# Patient Record
Sex: Female | Born: 1945 | Race: Black or African American | Hispanic: No | Marital: Single | Smoking: Never smoker
Health system: Southern US, Community
[De-identification: ages and names within clinical notes are randomized; demographics above are authoritative.]

## PROBLEM LIST (undated history)

## (undated) DIAGNOSIS — I10 Essential (primary) hypertension: Secondary | ICD-10-CM

---

## 2008-07-08 ENCOUNTER — Emergency Department (HOSPITAL_COMMUNITY): Admission: EM | Admit: 2008-07-08 | Discharge: 2008-07-08 | Payer: Self-pay | Admitting: Family Medicine

## 2013-09-18 ENCOUNTER — Encounter (HOSPITAL_COMMUNITY): Payer: Self-pay | Admitting: Emergency Medicine

## 2013-09-18 ENCOUNTER — Emergency Department (INDEPENDENT_AMBULATORY_CARE_PROVIDER_SITE_OTHER)
Admission: EM | Admit: 2013-09-18 | Discharge: 2013-09-18 | Disposition: A | Discharge status: Home / Self Care | Payer: Medicare Other | Source: Home / Self Care | Attending: Family Medicine | Admitting: Family Medicine

## 2013-09-18 DIAGNOSIS — J069 Acute upper respiratory infection, unspecified: Secondary | ICD-10-CM

## 2013-09-18 DIAGNOSIS — B9789 Other viral agents as the cause of diseases classified elsewhere: Principal | ICD-10-CM

## 2013-09-18 HISTORY — DX: Essential (primary) hypertension: I10

## 2013-09-18 MED ORDER — METHYLPREDNISOLONE 4 MG PO KIT: PACK | ORAL | Status: DC

## 2013-09-18 MED ORDER — GUAIFENESIN-CODEINE 100-10 MG/5ML PO SOLN: 10.0000 mL | Freq: Four times a day (QID) | ORAL | Status: AC | PRN

## 2013-09-18 NOTE — ED Provider Notes (Signed)
CSN: [id number]     Arrival date & time 09/18/13  1224 History   First MD Initiated Contact with Patient 09/18/13 1251     Chief Complaint  Patient presents with  . URI     (Consider location/radiation/quality/duration/timing/severity/associated sxs/prior Treatment) HPI Comments: Of cough and sore throat. For 4 days, she has had a bad cough she was having pain in her chest with coughing, but this is now resolved. She was having a bad sore throat, but this is better now too. All her symptoms are almost completely better now. She thinks she needs an antibiotic to make sure she stays better. denies pleuritic pain or shortness of breath  Patient is a 68 y.o. female presenting with URI.  URI Presenting symptoms: congestion and sore throat   Presenting symptoms: no cough and no fever   Associated symptoms: no arthralgias and no myalgias     Past Medical History  Diagnosis Date  . Hypertension    History reviewed. No pertinent past surgical history. History reviewed. No pertinent family history. History  Substance Use Topics  . Smoking status: Never Smoker   . Smokeless tobacco: Not on file  . Alcohol Use: No   OB History   Grav Para Term Preterm Abortions TAB SAB Ect Mult Living                 Review of Systems  Constitutional: Negative for fever and chills.  HENT: Positive for congestion and sore throat.   Eyes: Negative for visual disturbance.  Respiratory: Negative for cough and shortness of breath.   Cardiovascular: Negative for chest pain, palpitations and leg swelling.  Gastrointestinal: Negative for nausea, vomiting and abdominal pain.  Endocrine: Negative for polydipsia and polyuria.  Genitourinary: Negative for dysuria, urgency and frequency.  Musculoskeletal: Negative for arthralgias and myalgias.  Skin: Negative for rash.  Neurological: Negative for dizziness, weakness and light-headedness.      Allergies  Review of patient's allergies indicates no known  allergies.  Home Medications   Current Outpatient Rx  Name  Route  Sig  Dispense  Refill  . NIFEDIPINE PO   Oral   Take by mouth.         Marland Kitchen guaiFENesin-codeine 100-10 MG/5ML syrup   Oral   Take 10 mLs by mouth every 6 (six) hours as needed for cough.   120 mL   0   . methylPREDNISolone (MEDROL DOSEPAK) 4 MG tablet      follow package directions   21 tablet   0     Dispense as written.    BP 196/106  Pulse 90  Temp(Src) 98.4 F (36.9 C) (Oral)  Resp 20  SpO2 100% Physical Exam  Nursing note and vitals reviewed. Constitutional: She is oriented to person, place, and time. Vital signs are normal. She appears well-developed and well-nourished. No distress.  HENT:  Head: Normocephalic and atraumatic.  Right Ear: External ear normal.  Left Ear: External ear normal.  Nose: Nose normal.  Mouth/Throat: Oropharynx is clear and moist. No oropharyngeal exudate.  Eyes: Conjunctivae are normal. Right eye exhibits no discharge. Left eye exhibits no discharge.  Neck: Normal range of motion. Neck supple.  Cardiovascular: Normal rate, regular rhythm and normal heart sounds.  Exam reveals no gallop and no friction rub.   No murmur heard. Pulmonary/Chest: Effort normal. No respiratory distress. She has no wheezes. She has no rales.  Lymphadenopathy:    She has no cervical adenopathy.  Neurological: She is alert and oriented  to person, place, and time. She has normal strength. Coordination normal.  Skin: Skin is warm and dry. No rash noted. She is not diaphoretic.  Psychiatric: She has a normal mood and affect. Judgment normal.    ED Course  Procedures (including critical care time) Labs Review Labs Reviewed - No data to display Imaging Review No results found.    MDM   Final diagnoses:  Viral URI with cough    Physical exam is normal. Viral URI that is resolving. We'll treat symptomatically. Followup when necessary  Meds ordered this encounter  Medications  .  NIFEDIPINE PO    Sig: Take by mouth.  . methylPREDNISolone (MEDROL DOSEPAK) 4 MG tablet    Sig: follow package directions    Dispense:  21 tablet    Refill:  0    Order Specific Question:  Supervising Provider    Answer:  Clementeen GrahamOREY, EVAN, S [[id number]]  . guaiFENesin-codeine 100-10 MG/5ML syrup    Sig: Take 10 mLs by mouth every 6 (six) hours as needed for cough.    Dispense:  120 mL    Refill:  0    Order Specific Question:  Supervising Provider    Answer:  Clementeen GrahamOREY, EVAN, Kathie RhodesS [[id number]]      Graylon GoodZachary H Shaquia Berkley, PA-C 09/18/13 1304

## 2013-09-18 NOTE — ED Notes (Signed)
Pt  States  She  Has  Has  Symptoms  for  Several  Weeks    She  States  It  Started  Off  As  Sinus  Symptoms  But  Now  Has  Settled  In  Chest         She  Reports  Symptoms      Not  releived  By otc meds

## 2013-09-18 NOTE — Discharge Instructions (Signed)
Antibiotic Nonuse ° Your caregiver felt that the infection or problem was not one that would be helped with an antibiotic. °Infections may be caused by viruses or bacteria. Only a caregiver can tell which one of these is the likely cause of an illness. A cold is the most common cause of infection in both adults and children. A cold is a virus. Antibiotic treatment will have no effect on a viral infection. Viruses can lead to many lost days of work caring for sick children and many missed days of school. Children may catch as many as 10 "colds" or "flus" per year during which they can be tearful, cranky, and uncomfortable. The goal of treating a virus is aimed at keeping the ill person comfortable. °Antibiotics are medications used to help the body fight bacterial infections. There are relatively few types of bacteria that cause infections but there are hundreds of viruses. While both viruses and bacteria cause infection they are very different types of germs. A viral infection will typically go away by itself within 7 to 10 days. Bacterial infections may spread or get worse without antibiotic treatment. °Examples of bacterial infections are: °· Sore throats (like strep throat or tonsillitis). °· Infection in the lung (pneumonia). °· Ear and skin infections. °Examples of viral infections are: °· Colds or flus. °· Most coughs and bronchitis. °· Sore throats not caused by Strep. °· Runny noses. °It is often best not to take an antibiotic when a viral infection is the cause of the problem. Antibiotics can kill off the helpful bacteria that we have inside our body and allow harmful bacteria to start growing. Antibiotics can cause side effects such as allergies, nausea, and diarrhea without helping to improve the symptoms of the viral infection. Additionally, repeated uses of antibiotics can cause bacteria inside of our body to become resistant. That resistance can be passed onto harmful bacterial. The next time you have  an infection it may be harder to treat if antibiotics are used when they are not needed. Not treating with antibiotics allows our own immune system to develop and take care of infections more efficiently. Also, antibiotics will work better for us when they are prescribed for bacterial infections. °Treatments for a child that is ill may include: °· Give extra fluids throughout the day to stay hydrated. °· Get plenty of rest. °· Only give your child over-the-counter or prescription medicines for pain, discomfort, or fever as directed by your caregiver. °· The use of a cool mist humidifier may help stuffy noses. °· Cold medications if suggested by your caregiver. °Your caregiver may decide to start you on an antibiotic if: °· The problem you were seen for today continues for a longer length of time than expected. °· You develop a secondary bacterial infection. °SEEK MEDICAL CARE IF: °· Fever lasts longer than 5 days. °· Symptoms continue to get worse after 5 to 7 days or become severe. °· Difficulty in breathing develops. °· Signs of dehydration develop (poor drinking, rare urinating, dark colored urine). °· Changes in behavior or worsening tiredness (listlessness or lethargy). °Document Released: 09/20/2001 Document Revised: 10/04/2011 Document Reviewed: 03/19/2009 °ExitCare® Patient Information ©2014 [employer]. ° °Cough, Adult ° A cough is a reflex that helps clear your throat and airways. It can help heal the body or may be a reaction to an irritated airway. A cough may only last 2 or 3 weeks (acute) or may last more than 8 weeks (chronic).  °CAUSES °Acute cough: °· Viral or   bacterial infections. °Chronic cough: °· Infections. °· Allergies. °· Asthma. °· Post-nasal drip. °· Smoking. °· Heartburn or acid reflux. °· Some medicines. °· Chronic lung problems (COPD). °· Cancer. °SYMPTOMS  °· Cough. °· Fever. °· Chest pain. °· Increased breathing rate. °· High-pitched whistling sound when breathing  (wheezing). °· Colored mucus that you cough up (sputum). °TREATMENT  °· A bacterial cough may be treated with antibiotic medicine. °· A viral cough must run its course and will not respond to antibiotics. °· Your caregiver may recommend other treatments if you have a chronic cough. °HOME CARE INSTRUCTIONS  °· Only take over-the-counter or prescription medicines for pain, discomfort, or fever as directed by your caregiver. Use cough suppressants only as directed by your caregiver. °· Use a cold steam vaporizer or humidifier in your bedroom or home to help loosen secretions. °· Sleep in a semi-upright position if your cough is worse at night. °· Rest as needed. °· Stop smoking if you smoke. °SEEK IMMEDIATE MEDICAL CARE IF:  °· You have pus in your sputum. °· Your cough starts to worsen. °· You cannot control your cough with suppressants and are losing sleep. °· You begin coughing up blood. °· You have difficulty breathing. °· You develop pain which is getting worse or is uncontrolled with medicine. °· You have a fever. °MAKE SURE YOU:  °· Understand these instructions. °· Will watch your condition. °· Will get help right away if you are not doing well or get worse. °Document Released: 01/08/2011 Document Revised: 10/04/2011 Document Reviewed: 01/08/2011 °ExitCare® Patient Information ©2014 [employer]. ° °

## 2013-09-19 NOTE — ED Provider Notes (Signed)
Medical screening examination/treatment/procedure(s) were performed by a resident physician or non-physician practitioner and as the supervising physician I was immediately available for consultation/collaboration.  Najma Bozarth, MD    Eartha Vonbehren S Dorthea Maina, MD 09/19/13 0740 

## 2014-04-11 ENCOUNTER — Emergency Department (INDEPENDENT_AMBULATORY_CARE_PROVIDER_SITE_OTHER)
Admission: EM | Admit: 2014-04-11 | Discharge: 2014-04-11 | Disposition: A | Discharge status: Home / Self Care | Payer: Medicare Other | Source: Home / Self Care | Attending: Family Medicine | Admitting: Family Medicine

## 2014-04-11 ENCOUNTER — Encounter (HOSPITAL_COMMUNITY): Payer: Self-pay | Admitting: Emergency Medicine

## 2014-04-11 DIAGNOSIS — A088 Other specified intestinal infections: Secondary | ICD-10-CM

## 2014-04-11 DIAGNOSIS — A084 Viral intestinal infection, unspecified: Secondary | ICD-10-CM

## 2014-04-11 MED ORDER — ONDANSETRON HCL 4 MG PO TABS: 4.0000 mg | ORAL_TABLET | Freq: Three times a day (TID) | ORAL | Status: AC | PRN

## 2014-04-11 NOTE — Discharge Instructions (Signed)
Thank you for coming in today. Use over-the-counter Imodium as needed.  Use Zofran as needed for vomiting.   If your belly pain worsens, or you have high fever, bad vomiting, blood in your stool or black tarry stool go to the Emergency Room.   Viral Gastroenteritis Viral gastroenteritis is also known as stomach flu. This condition affects the stomach and intestinal tract. It can cause sudden diarrhea and vomiting. The illness typically lasts 3 to 8 days. Most people develop an immune response that eventually gets rid of the virus. While this natural response develops, the virus can make you quite ill. CAUSES  Many different viruses can cause gastroenteritis, such as rotavirus or noroviruses. You can catch one of these viruses by consuming contaminated food or water. You may also catch a virus by sharing utensils or other personal items with an infected person or by touching a contaminated surface. SYMPTOMS  The most common symptoms are diarrhea and vomiting. These problems can cause a severe loss of body fluids (dehydration) and a body salt (electrolyte) imbalance. Other symptoms may include:  Fever.  Headache.  Fatigue.  Abdominal pain. DIAGNOSIS  Your caregiver can usually diagnose viral gastroenteritis based on your symptoms and a physical exam. A stool sample may also be taken to test for the presence of viruses or other infections. TREATMENT  This illness typically goes away on its own. Treatments are aimed at rehydration. The most serious cases of viral gastroenteritis involve vomiting so severely that you are not able to keep fluids down. In these cases, fluids must be given through an intravenous line (IV). HOME CARE INSTRUCTIONS   Drink enough fluids to keep your urine clear or pale yellow. Drink small amounts of fluids frequently and increase the amounts as tolerated.  Ask your caregiver for specific rehydration instructions.  Avoid:  Foods high in  sugar.  Alcohol.  Carbonated drinks.  Tobacco.  Juice.  Caffeine drinks.  Extremely hot or cold fluids.  Fatty, greasy foods.  Too much intake of anything at one time.  Dairy products until 24 to 48 hours after diarrhea stops.  You may consume probiotics. Probiotics are active cultures of beneficial bacteria. They may lessen the amount and number of diarrheal stools in adults. Probiotics can be found in yogurt with active cultures and in supplements.  Wash your hands well to avoid spreading the virus.  Only take over-the-counter or prescription medicines for pain, discomfort, or fever as directed by your caregiver. Do not give aspirin to children. Antidiarrheal medicines are not recommended.  Ask your caregiver if you should continue to take your regular prescribed and over-the-counter medicines.  Keep all follow-up appointments as directed by your caregiver. SEEK IMMEDIATE MEDICAL CARE IF:   You are unable to keep fluids down.  You do not urinate at least once every 6 to 8 hours.  You develop shortness of breath.  You notice blood in your stool or vomit. This may look like coffee grounds.  You have abdominal pain that increases or is concentrated in one small area (localized).  You have persistent vomiting or diarrhea.  You have a fever.  The patient is a child younger than 3 months, and he or she has a fever.  The patient is a child older than 3 months, and he or she has a fever and persistent symptoms.  The patient is a child older than 3 months, and he or she has a fever and symptoms suddenly get worse.  The patient is a  baby, and he or she has no tears when crying. MAKE SURE YOU:   Understand these instructions.  Will watch your condition.  Will get help right away if you are not doing well or get worse. Document Released: 07/12/2005 Document Revised: 10/04/2011 Document Reviewed: 04/28/2011 [facility] Patient Information 2015 [place], [place]. This  information is not intended to replace advice given to you by your health care provider. Make sure you discuss any questions you have with your health care provider.

## 2014-04-11 NOTE — ED Provider Notes (Signed)
Jun Acocella is a 68 y.o. female who presents to Urgent Care today for diarrhea. Patient has a two-day history of diarrhea associated with nausea. No fevers or chills. No abdominal pain. No blood in the stool. No medications tried yet. Patient feels well otherwise.   Past Medical History  Diagnosis Date  . Hypertension    History  Substance Use Topics  . Smoking status: Never Smoker   . Smokeless tobacco: Not on file  . Alcohol Use: No   ROS as above Medications: No current facility-administered medications for this encounter.   Current Outpatient Prescriptions  Medication Sig Dispense Refill  . LISINOPRIL PO Take by mouth.      Marland Kitchen guaiFENesin-codeine 100-10 MG/5ML syrup Take 10 mLs by mouth every 6 (six) hours as needed for cough.  120 mL  0  . methylPREDNISolone (MEDROL DOSEPAK) 4 MG tablet follow package directions  21 tablet  0  . NIFEDIPINE PO Take by mouth.      . ondansetron (ZOFRAN) 4 MG tablet Take 1 tablet (4 mg total) by mouth every 8 (eight) hours as needed for nausea or vomiting.  20 tablet  0    Exam:  BP 134/70  Pulse 83  Temp(Src) 98.3 F (36.8 C) (Oral)  Resp 16  SpO2 100% Gen: Well NAD nontoxic appearing HEENT: EOMI,  MMM Lungs: Normal work of breathing. CTABL Heart: RRR no MRG Abd: NABS, Soft. Nondistended, Nontender Exts: Brisk capillary refill, warm and well perfused.   No results found for this or any previous visit (from the past 24 hour(s)). No results found.  Assessment and Plan: 68 y.o. female with viral gastroenteritis. Plan to treat with Imodium and Zofran. Watchful waiting. Followup as needed.  Discussed warning signs or symptoms. Please see discharge instructions. Patient expresses understanding.     Rodolph Bong, MD 04/11/14 1224

## 2014-04-11 NOTE — ED Notes (Signed)
Pt triaged and assessed by provider.   Provider in room before nurse.  

## 2014-07-03 ENCOUNTER — Encounter (HOSPITAL_COMMUNITY): Payer: Self-pay | Admitting: Emergency Medicine

## 2014-07-03 ENCOUNTER — Emergency Department (INDEPENDENT_AMBULATORY_CARE_PROVIDER_SITE_OTHER)
Admission: EM | Admit: 2014-07-03 | Discharge: 2014-07-03 | Disposition: A | Discharge status: Home / Self Care | Payer: Medicare Other | Source: Home / Self Care | Attending: Emergency Medicine | Admitting: Emergency Medicine

## 2014-07-03 DIAGNOSIS — J9801 Acute bronchospasm: Secondary | ICD-10-CM

## 2014-07-03 DIAGNOSIS — J189 Pneumonia, unspecified organism: Secondary | ICD-10-CM

## 2014-07-03 MED ORDER — BENZONATATE 100 MG PO CAPS: 100.0000 mg | ORAL_CAPSULE | Freq: Three times a day (TID) | ORAL | Status: AC | PRN

## 2014-07-03 MED ORDER — ALBUTEROL SULFATE HFA 108 (90 BASE) MCG/ACT IN AERS: 1.0000 | INHALATION_SPRAY | Freq: Four times a day (QID) | RESPIRATORY_TRACT | Status: AC | PRN

## 2014-07-03 MED ORDER — AZITHROMYCIN 250 MG PO TABS: 250.0000 mg | ORAL_TABLET | Freq: Every day | ORAL | Status: DC

## 2014-07-03 NOTE — ED Notes (Signed)
68 year old female here today with complaints of what she states as "flu like symptoms for the past 2 weeks.  States it started off with a cough. Her cough has deepened  Chest is now hurting  Nausea and vomiting last evening. Patients O2 Sat on room air is 95 %   Right lung has rales in middle lobe.  Lung sounds decreased in right lower lobe.

## 2014-07-03 NOTE — ED Provider Notes (Signed)
CSN: [id number]     Arrival date & time 07/03/14  1230 History   First MD Initiated Contact with Patient 07/03/14 1240     Chief Complaint  Patient presents with  . Cough   (Consider location/radiation/quality/duration/timing/severity/associated sxs/prior Treatment) HPI Comments: Reports development of wheezing and chills over last few days. Denies dyspnea, chest pain, fever, hemoptysis, night sweats, weight loss. Is a non-smoker. PCP: Dr. Michelle NasutiStephanie Powell Works in Set designermanufacturing at [employer]  Patient is a 68 y.o. female presenting with URI. The history is provided by the patient.  URI Presenting symptoms: congestion and cough   Presenting symptoms: no fever   Presenting symptoms comment:  +chills Severity:  Moderate Onset quality:  Gradual Duration:  2 weeks Timing:  Constant Progression:  Worsening Chronicity:  New Associated symptoms: wheezing   Associated symptoms: no headaches   Risk factors: no immunosuppression and no sick contacts     Past Medical History  Diagnosis Date  . Hypertension    History reviewed. No pertinent past surgical history. No family history on file. History  Substance Use Topics  . Smoking status: Never Smoker   . Smokeless tobacco: Not on file  . Alcohol Use: No   OB History    No data available     Review of Systems  Constitutional: Positive for chills. Negative for fever.  HENT: Positive for congestion.   Respiratory: Positive for cough and wheezing.   Cardiovascular: Negative.   Gastrointestinal: Negative.   Musculoskeletal: Negative for back pain.  Skin: Negative.   Neurological: Negative for dizziness, weakness, light-headedness and headaches.    Allergies  Review of patient's allergies indicates no known allergies.  Home Medications   Prior to Admission medications   Medication Sig Start Date End Date Taking? Authorizing Provider  amLODipine (NORVASC) 10 MG tablet Take 10 mg by mouth daily.   Yes Historical Provider, MD    albuterol (PROVENTIL HFA;VENTOLIN HFA) 108 (90 BASE) MCG/ACT inhaler Inhale 1-2 puffs into the lungs every 6 (six) hours as needed for wheezing or shortness of breath (or persistent coughing). 07/03/14   Mathis FareJennifer Lee H Shanyia Stines, PA  azithromycin (ZITHROMAX) 250 MG tablet Take 1 tablet (250 mg total) by mouth daily. Take first 2 tablets together, then 1 every day until finished. 07/03/14   Mathis FareJennifer Lee H Suha Schoenbeck, PA  benzonatate (TESSALON) 100 MG capsule Take 1 capsule (100 mg total) by mouth 3 (three) times daily as needed for cough. 07/03/14   Mathis FareJennifer Lee H Declan Adamson, PA  guaiFENesin-codeine 100-10 MG/5ML syrup Take 10 mLs by mouth every 6 (six) hours as needed for cough. 09/18/13   Graylon GoodZachary H Baker, PA-C  LISINOPRIL PO Take by mouth.    Historical Provider, MD  methylPREDNISolone (MEDROL DOSEPAK) 4 MG tablet follow package directions 09/18/13   Graylon GoodZachary H Baker, PA-C  NIFEDIPINE PO Take by mouth.    Historical Provider, MD  ondansetron (ZOFRAN) 4 MG tablet Take 1 tablet (4 mg total) by mouth every 8 (eight) hours as needed for nausea or vomiting. 04/11/14   Rodolph BongEvan S Corey, MD   BP 125/72 mmHg  Pulse 92  Temp(Src) 99.9 F (37.7 C) (Oral)  Resp 17  SpO2 95% Physical Exam  Constitutional: She is oriented to person, place, and time. She appears well-developed and well-nourished. No distress.  HENT:  Head: Normocephalic and atraumatic.  Right Ear: Hearing, tympanic membrane, external ear and ear canal normal.  Left Ear: Hearing, tympanic membrane, external ear and ear canal normal.  Nose: Nose normal.  Mouth/Throat: Uvula is midline, oropharynx is clear and moist and mucous membranes are normal.  Eyes: Conjunctivae are normal. No scleral icterus.  Neck: Normal range of motion. Neck supple.  Cardiovascular: Normal rate, regular rhythm and normal heart sounds.   Pulmonary/Chest: Effort normal. No accessory muscle usage. No respiratory distress. She has wheezes. She has rhonchi in the right lower field.  She has no rales.    Outlined area is with coarse persistent rhonchi Diffuse mild end expiratory wheezing  Lymphadenopathy:    She has no cervical adenopathy.  Neurological: She is alert and oriented to person, place, and time.  Skin: Skin is warm and dry.  Psychiatric: She has a normal mood and affect. Her behavior is normal.  Nursing note and vitals reviewed.   ED Course  Procedures (including critical care time) Labs Review Labs Reviewed - No data to display  Imaging Review No results found.   MDM   1. CAP (community acquired pneumonia)   2. Bronchospasm     Exam suggests right lower lung field consolidation with associated mild bronchospasm without hypoxia or distress. Hemodynamically stable and without indications of sepsis. Will treat for CAP with 5 day course of azithromycin along with tessalon for cough and albuterol MDI for bronchospasm. Advise close follow up with either UCC or PCP.    Ria ClockJennifer Lee H Ozetta Flatley, [place]PA 07/03/14 1329

## 2014-07-03 NOTE — Discharge Instructions (Signed)
Bronchospasm  A bronchospasm is a spasm or tightening of the airways going into the lungs. During a bronchospasm breathing becomes more difficult because the airways get smaller. When this happens there can be coughing, a whistling sound when breathing (wheezing), and difficulty breathing. Bronchospasm is often associated with asthma, but not all patients who experience a bronchospasm have asthma.  CAUSES   A bronchospasm is caused by inflammation or irritation of the airways. The inflammation or irritation may be triggered by:    Allergies (such as to animals, pollen, food, or mold). Allergens that cause bronchospasm may cause wheezing immediately after exposure or many hours later.    Infection. Viral infections are believed to be the most common cause of bronchospasm.    Exercise.    Irritants (such as pollution, cigarette smoke, strong odors, aerosol sprays, and paint fumes).    Weather changes. Winds increase molds and pollens in the air. Rain refreshes the air by washing irritants out. Cold air may cause inflammation.    Stress and emotional upset.   SIGNS AND SYMPTOMS    Wheezing.    Excessive nighttime coughing.    Frequent or severe coughing with a simple cold.    Chest tightness.    Shortness of breath.   DIAGNOSIS   Bronchospasm is usually diagnosed through a history and physical exam. Tests, such as chest X-rays, are sometimes done to look for other conditions.  TREATMENT    Inhaled medicines can be given to open up your airways and help you breathe. The medicines can be given using either an inhaler or a nebulizer machine.   Corticosteroid medicines may be given for severe bronchospasm, usually when it is associated with asthma.  HOME CARE INSTRUCTIONS    Always have a plan prepared for seeking medical care. Know when to call your health care provider and local emergency services (911 in the [place] Know where you can access local emergency care.   Only take medicines as  directed by your health care provider.   If you were prescribed an inhaler or nebulizer machine, ask your health care provider to explain how to use it correctly. Always use a spacer with your inhaler if you were given one.   It is necessary to remain calm during an attack. Try to relax and breathe more slowly.   Control your home environment in the following ways:    Change your heating and air conditioning filter at least once a month.    Limit your use of fireplaces and wood stoves.   Do not smoke and do not allow smoking in your home.    Avoid exposure to perfumes and fragrances.    Get rid of pests (such as roaches and mice) and their droppings.    Throw away plants if you see mold on them.    Keep your house clean and dust free.    Replace carpet with wood, tile, or vinyl flooring. Carpet can trap dander and dust.    Use allergy-proof pillows, mattress covers, and box spring covers.    Wash bed sheets and blankets every week in hot water and dry them in a dryer.    Use blankets that are made of polyester or cotton.    Wash hands frequently.  SEEK MEDICAL CARE IF:    You have muscle aches.    You have chest pain.    The sputum changes from clear or white to yellow, green, gray, or bloody.    The sputum you   cough up gets thicker.    There are problems that may be related to the medicine you are given, such as a rash, itching, swelling, or trouble breathing.   SEEK IMMEDIATE MEDICAL CARE IF:    You have worsening wheezing and coughing even after taking your prescribed medicines.    You have increased difficulty breathing.    You develop severe chest pain.  MAKE SURE YOU:    Understand these instructions.   Will watch your condition.   Will get help right away if you are not doing well or get worse.  Document Released: 07/15/2003 Document Revised: 07/17/2013 Document Reviewed: 01/01/2013  Exit[employer] Patient Information 2015 [employer]. This information is not  intended to replace advice given to you by your health care provider. Make sure you discuss any questions you have with your health care provider.  Pneumonia  Pneumonia is an infection of the lungs.   CAUSES  Pneumonia may be caused by bacteria or a virus. Usually, these infections are caused by breathing infectious particles into the lungs (respiratory tract).  SIGNS AND SYMPTOMS    Cough.   Fever.   Chest pain.   Increased rate of breathing.   Wheezing.   Mucus production.  DIAGNOSIS   If you have the common symptoms of pneumonia, your health care provider will typically confirm the diagnosis with a chest X-ray. The X-ray will show an abnormality in the lung (pulmonary infiltrate) if you have pneumonia. Other tests of your blood, urine, or sputum may be done to find the specific cause of your pneumonia. Your health care provider may also do tests (blood gases or pulse oximetry) to see how well your lungs are working.  TREATMENT   Some forms of pneumonia may be spread to other people when you cough or sneeze. You may be asked to wear a mask before and during your exam. Pneumonia that is caused by bacteria is treated with antibiotic medicine. Pneumonia that is caused by the influenza virus may be treated with an antiviral medicine. Most other viral infections must run their course. These infections will not respond to antibiotics.   HOME CARE INSTRUCTIONS    Cough suppressants may be used if you are losing too much rest. However, coughing protects you by clearing your lungs. You should avoid using cough suppressants if you can.   Your health care provider may have prescribed medicine if he or she thinks your pneumonia is caused by bacteria or influenza. Finish your medicine even if you start to feel better.   Your health care provider may also prescribe an expectorant. This loosens the mucus to be coughed up.   Take medicines only as directed by your health care provider.   Do not smoke. Smoking is a  common cause of bronchitis and can contribute to pneumonia. If you are a smoker and continue to smoke, your cough may last several weeks after your pneumonia has cleared.   A cold steam vaporizer or humidifier in your room or home may help loosen mucus.   Coughing is often worse at night. Sleeping in a semi-upright position in a recliner or using a couple pillows under your head will help with this.   Get rest as you feel it is needed. Your body will usually let you know when you need to rest.  PREVENTION  A pneumococcal shot (vaccine) is available to prevent a common bacterial cause of pneumonia. This is usually suggested for:   People over 65 years old.     Patients on chemotherapy.   People with chronic lung problems, such as bronchitis or emphysema.   People with immune system problems.  If you are over 65 or have a high risk condition, you may receive the pneumococcal vaccine if you have not received it before. In some countries, a routine influenza vaccine is also recommended. This vaccine can help prevent some cases of pneumonia.You may be offered the influenza vaccine as part of your care.  If you smoke, it is time to quit. You may receive instructions on how to stop smoking. Your health care provider can provide medicines and counseling to help you quit.  SEEK MEDICAL CARE IF:  You have a fever.  SEEK IMMEDIATE MEDICAL CARE IF:    Your illness becomes worse. This is especially true if you are elderly or weakened from any other disease.   You cannot control your cough with suppressants and are losing sleep.   You begin coughing up blood.   You develop pain which is getting worse or is uncontrolled with medicines.   Any of the symptoms which initially brought you in for treatment are getting worse rather than better.   You develop shortness of breath or chest pain.  MAKE SURE YOU:    Understand these instructions.   Will watch your condition.   Will get help right away if you are not doing well  or get worse.  Document Released: 07/12/2005 Document Revised: 11/26/2013 Document Reviewed: 10/01/2010  Exit[employer] Patient Information 2015 [employer]. This information is not intended to replace advice given to you by your health care provider. Make sure you discuss any questions you have with your health care provider.

## 2018-05-15 ENCOUNTER — Encounter (HOSPITAL_COMMUNITY): Payer: Self-pay | Admitting: Emergency Medicine

## 2018-05-15 ENCOUNTER — Emergency Department (HOSPITAL_COMMUNITY): Payer: Medicare Other

## 2018-05-15 ENCOUNTER — Emergency Department (HOSPITAL_COMMUNITY)
Admission: EM | Admit: 2018-05-15 | Discharge: 2018-05-15 | Discharge status: Against Medical Advice | Payer: Medicare Other | Attending: Emergency Medicine | Admitting: Emergency Medicine

## 2018-05-15 DIAGNOSIS — Z5320 Procedure and treatment not carried out because of patient's decision for unspecified reasons: Secondary | ICD-10-CM | POA: Insufficient documentation

## 2018-05-15 DIAGNOSIS — M25571 Pain in right ankle and joints of right foot: Secondary | ICD-10-CM | POA: Insufficient documentation

## 2018-05-15 DIAGNOSIS — M25562 Pain in left knee: Secondary | ICD-10-CM | POA: Diagnosis not present

## 2018-05-15 DIAGNOSIS — M542 Cervicalgia: Secondary | ICD-10-CM | POA: Diagnosis present

## 2018-05-15 DIAGNOSIS — Z5329 Procedure and treatment not carried out because of patient's decision for other reasons: Secondary | ICD-10-CM

## 2018-05-15 DIAGNOSIS — I10 Essential (primary) hypertension: Secondary | ICD-10-CM | POA: Diagnosis not present
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
## 2018-05-15 NOTE — Discharge Instructions (Addendum)
Today you refused a CT scan of your head, the medical advice is for you to obtain the CT scan of your head.  You are at an increased risk of having a head injury that over time can evolve into serious bleeding which could lead to stroke, disability which may be severe, and death.  As you refused this scan you are leaving the hospital AGAINST MEDICAL ADVICE.  If you develop any concerning symptoms, or wish for the scan to be performed please return to the emergency room.  Please take Tylenol (acetaminophen) to relieve your pain.  You may take tylenol, up to 1,000 mg (two extra strength pills).  Do not take more than 3,000 mg tylenol in a 24 hour period.  Please check all medication labels as many medications such as pain and cold medications may contain tylenol. Please do not drink alcohol while taking this medication.

## 2018-05-15 NOTE — ED Notes (Signed)
ED Provider at bedside. 
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
## 2018-05-15 NOTE — ED Notes (Signed)
Pt states she wants to leave. States she has notified the PA. This RN advised against pt leaving prior to being discharged. Pt states she still is going to leave.

## 2018-06-24 ENCOUNTER — Emergency Department (HOSPITAL_COMMUNITY)
Admission: EM | Admit: 2018-06-24 | Discharge: 2018-06-24 | Disposition: A | Discharge status: Home / Self Care | Payer: Medicare Other | Attending: Emergency Medicine | Admitting: Emergency Medicine

## 2018-06-24 ENCOUNTER — Emergency Department (HOSPITAL_COMMUNITY): Payer: Medicare Other

## 2018-06-24 ENCOUNTER — Other Ambulatory Visit: Payer: Self-pay

## 2018-06-24 DIAGNOSIS — R05 Cough: Secondary | ICD-10-CM | POA: Diagnosis present

## 2018-06-24 DIAGNOSIS — Z7982 Long term (current) use of aspirin: Secondary | ICD-10-CM | POA: Diagnosis not present

## 2018-06-24 DIAGNOSIS — Z79899 Other long term (current) drug therapy: Secondary | ICD-10-CM | POA: Diagnosis not present

## 2018-06-24 DIAGNOSIS — J209 Acute bronchitis, unspecified: Secondary | ICD-10-CM | POA: Diagnosis not present

## 2018-06-24 DIAGNOSIS — I10 Essential (primary) hypertension: Secondary | ICD-10-CM | POA: Diagnosis not present

## 2018-06-24 LAB — CBC WITH DIFFERENTIAL/PLATELET
ABS IMMATURE GRANULOCYTES: 0.03 10*3/uL (ref 0.00–0.07)
BASOS ABS: 0.1 10*3/uL (ref 0.0–0.1)
Basophils Relative: 1 %
Eosinophils Absolute: 0.5 10*3/uL (ref 0.0–0.5)
Eosinophils Relative: 9 %
HCT: 38.3 % (ref 36.0–46.0)
HEMOGLOBIN: 11.7 g/dL — AB (ref 12.0–15.0)
Immature Granulocytes: 1 %
Lymphocytes Relative: 35 %
Lymphs Abs: 2.1 10*3/uL (ref 0.7–4.0)
MCH: 27.9 pg (ref 26.0–34.0)
MCHC: 30.5 g/dL (ref 30.0–36.0)
MCV: 91.4 fL (ref 80.0–100.0)
Monocytes Absolute: 0.4 10*3/uL (ref 0.1–1.0)
Monocytes Relative: 7 %
Neutro Abs: 2.8 10*3/uL (ref 1.7–7.7)
Neutrophils Relative %: 47 %
Platelets: 390 10*3/uL (ref 150–400)
RBC: 4.19 MIL/uL (ref 3.87–5.11)
RDW: 13.4 % (ref 11.5–15.5)
WBC: 5.9 10*3/uL (ref 4.0–10.5)
nRBC: 0 % (ref 0.0–0.2)

## 2018-06-24 LAB — BASIC METABOLIC PANEL
Anion gap: 12 (ref 5–15)
BUN: 17 mg/dL (ref 8–23)
CALCIUM: 9.4 mg/dL (ref 8.9–10.3)
CO2: 24 mmol/L (ref 22–32)
CREATININE: 1.15 mg/dL — AB (ref 0.44–1.00)
Chloride: 102 mmol/L (ref 98–111)
GFR calc Af Amer: 56 mL/min — ABNORMAL LOW (ref >60)
GFR calc non Af Amer: 48 mL/min — ABNORMAL LOW (ref >60)
Glucose, Bld: 91 mg/dL (ref 70–99)
Potassium: 3.5 mmol/L (ref 3.5–5.1)
Sodium: 138 mmol/L (ref 135–145)

## 2018-06-24 MED ORDER — DOXYCYCLINE HYCLATE 100 MG PO CAPS: 100.0000 mg | ORAL_CAPSULE | Freq: Two times a day (BID) | ORAL | 0 refills | Status: AC

## 2018-06-24 NOTE — Discharge Instructions (Addendum)
Drinking plenty of fluids and getting plenty of rest.  You can use Robitussin-DM as needed for cough.  Use Tylenol for pain or fever.

## 2018-06-24 NOTE — ED Triage Notes (Signed)
Pt states she has had cold symptoms for three weeks now. Pt states she is coughing up some white mucus with her cough. Pt complains of some chest pain only when coughing.

## 2018-06-24 NOTE — ED Provider Notes (Signed)
[facility] EMERGENCY DEPARTMENT Provider Note   CSN: [id number] Arrival date & time: 06/24/18  0719     History   Chief Complaint Chief Complaint  Patient presents with  . Cough    HPI Wanda Noble is a 72 y.o. female.  HPI   She presents for evaluation of cough, for 3 weeks, productive of white sputum without blood in it.  No history of asthma, bronchitis or emphysema.  She denies fever, chills, nausea, vomiting, weakness or dizziness.  There are no other known modifying factors.  Past Medical History:  Diagnosis Date  . Hypertension     Patient Active Problem List   Diagnosis Date Noted  . Acute pain of left knee 05/15/2018    No past surgical history on file.   OB History   None      Home Medications    Prior to Admission medications   Medication Sig Start Date End Date Taking? Authorizing Provider  acetaminophen (TYLENOL) 500 MG tablet Take 500 mg by mouth every 6 (six) hours as needed for mild pain or fever.   Yes [provider]  amLODipine (NORVASC) 10 MG tablet Take 10 mg by mouth daily.   Yes [provider]  aspirin 81 MG chewable tablet Chew 81 mg by mouth daily.   Yes [provider]  atorvastatin (LIPITOR) 10 MG tablet Take 10 mg by mouth. 02/21/18  Yes [provider]  benzonatate (TESSALON) 100 MG capsule Take 1 capsule (100 mg total) by mouth 3 (three) times daily as needed for cough. 07/03/14  Yes Presson, Jess Barters H, PA  Calcium-Magnesium-Vitamin D (CALCIUM 1200+D3 PO) Take 1 tablet by mouth daily.    Yes [provider]  hydrochlorothiazide (HYDRODIURIL) 25 MG tablet Take 25 mg by mouth daily.   Yes [provider]  albuterol (PROVENTIL HFA;VENTOLIN HFA) 108 (90 BASE) MCG/ACT inhaler Inhale 1-2 puffs into the lungs every 6 (six) hours as needed for wheezing or shortness of breath (or persistent coughing). Patient not taking: Reported on 06/24/2018 07/03/14   Ria Clock, PA  doxycycline (VIBRAMYCIN) 100 MG capsule Take 1 capsule (100 mg total) by mouth 2 (two) times daily. One po bid x 7 days 06/24/18   Mancel Bale, MD  guaiFENesin-codeine 100-10 MG/5ML syrup Take 10 mLs by mouth every 6 (six) hours as needed for cough. Patient not taking: Reported on 06/24/2018 09/18/13   Autumn Messing H, PA-C  ondansetron (ZOFRAN) 4 MG tablet Take 1 tablet (4 mg total) by mouth every 8 (eight) hours as needed for nausea or vomiting. Patient not taking: Reported on 06/24/2018 04/11/14   Rodolph Bong, MD    Family History No family history on file.  Social History Social History   Tobacco Use  . Smoking status: Never Smoker  Substance Use Topics  . Alcohol use: No  . Drug use: No     Allergies   Patient has no known allergies.   Review of Systems Review of Systems  All other systems reviewed and are negative.    Physical Exam Updated Vital Signs BP (!) 170/67 (BP Location: Right Arm)   Pulse 72   Temp 98.5 F (36.9 C) (Oral)   Resp 16   Ht 5' 4.5" (1.638 m)   Wt 85.7 kg   SpO2 97%   BMI 31.94 kg/m   Physical Exam  Constitutional: She is oriented to person, place, and time. She appears well-developed. No distress.  Elderly, frail  HENT:  Head: Normocephalic and atraumatic.  Eyes: Pupils are equal, round, and reactive to light. Conjunctivae and EOM are normal.  Neck: Normal range of motion and phonation normal. Neck supple.  Cardiovascular: Normal rate and regular rhythm.  Pulmonary/Chest: Effort normal. She exhibits no tenderness.  Rhonchi left base only.  No rales or wheezes.  There is no increased work of breathing.  Musculoskeletal: Normal range of motion. She exhibits no edema, tenderness or deformity.  Neurological: She is alert and oriented to person, place, and time. She exhibits normal muscle tone.  Skin: Skin is warm and dry.  Psychiatric: She has a normal mood and affect. Her behavior is normal. Judgment and  thought content normal.  Nursing note and vitals reviewed.    ED Treatments / Results  Labs (all labs ordered are listed, but only abnormal results are displayed) Labs Reviewed  BASIC METABOLIC PANEL - Abnormal; Notable for the following components:      Result Value   Creatinine, Ser 1.15 (*)    GFR calc non Af Amer 48 (*)    GFR calc Af Amer 56 (*)    All other components within normal limits  CBC WITH DIFFERENTIAL/PLATELET - Abnormal; Notable for the following components:   Hemoglobin 11.7 (*)    All other components within normal limits    EKG None  Radiology Dg Chest 2 View  Result Date: 06/24/2018 CLINICAL DATA:  Pt reports productive cough w/ white mucus for almost 3 weeks; reports coughing is worse when lying on left side and she has wheezing when lying on left side only; denies CP or fever; also reports SOB with exertion; non-smoker; hx of HTN EXAM: CHEST - 2 VIEW COMPARISON:  none FINDINGS: Lungs are clear. Heart size and mediastinal contours are within normal limits. Aortic Atherosclerosis (ICD10-170.0). No effusion.  No pneumothorax. Anterior vertebral endplate spurring at multiple levels in the mid and lower thoracic spine. IMPRESSION: No acute cardiopulmonary disease. Electronically Signed   By: Corlis Leak M.D.   On: 06/24/2018 07:53    Procedures Procedures (including critical care time)  Medications Ordered in ED Medications - No data to display   Initial Impression / Assessment and Plan / ED Course  I have reviewed the triage vital signs and the nursing notes.  Pertinent labs & imaging results that were available during my care of the patient were reviewed by me and considered in my medical decision making (see chart for details).  Clinical Course as of Jun 24 922  Sat Jun 24, 2018  0827 Normal except hemoglobin low  CBC with Differential(!) [EW]  0827 No CHF or infiltrate, images reviewed by me  DG Chest 2 View [EW]  [phone] Normal  CBC with  Differential(!) [EW]    Clinical Course User Index [EW] Mancel Bale, MD     Patient Vitals for the past 24 hrs:  BP Temp Temp src Pulse Resp SpO2 Height Weight  06/24/18 0915 (!) 170/67 98.5 F (36.9 C) Oral 72 16 97 % - -  06/24/18 0833 (!) 163/94 - - 86 16 100 % - -  06/24/18 0732 (!) 148/72 - - 73 16 100 % - -  06/24/18 0730 - - - - - - 5' 4.5" (1.638 m) 85.7 kg  06/24/18 0729 (!) 185/88 98.7 F (37.1 C) - 71 16 100 % - -    9:21 AM Reevaluation with update and discussion. After initial assessment and treatment, an updated evaluation reveals she remains comfortable and has  no further complaints.  She prefers to be treated with antibiotic.  Findings discussed and questions answered. Mancel BaleElliott Marwin Primmer   Medical Decision Making: Subacute cough, with reassuring findings.  Patient desires an antibiotic to treat for possible bacterial bronchitis.  I think this is a low likelihood.  However, I cannot eliminate the possibility.  She will be treated with 1 week course of antibiotic, as a trial.  CRITICAL CARE-no Performed by: Mancel BaleElliott Hanaan Gancarz  Nursing Notes Reviewed/ Care Coordinated Applicable Imaging Reviewed Interpretation of Laboratory Data incorporated into ED treatment  The patient appears reasonably screened and/or stabilized for discharge and I doubt any other medical condition or other [facility] requiring further screening, evaluation, or treatment in the ED at this time prior to discharge.  Plan: Home Medications-continue usual medications, Robitussin-DM for cough, Tylenol for pain or fever; Home Treatments-rest, fluids, gradually increase activity; return here if the recommended treatment, does not improve the symptoms; Recommended follow up-PCP, PRN    Final Clinical Impressions(s) / ED Diagnoses   Final diagnoses:  Acute bronchitis, unspecified organism    ED Discharge Orders         Ordered    doxycycline (VIBRAMYCIN) 100 MG capsule  2 times daily     06/24/18 0919             Mancel BaleWentz, Janie Strothman, MD 06/24/18 [phone]

## 2018-06-24 NOTE — ED Notes (Signed)
Patient transported to X-ray 

## 2018-07-24 ENCOUNTER — Other Ambulatory Visit: Payer: Self-pay | Admitting: Family Medicine

## 2018-07-24 DIAGNOSIS — Z1231 Encounter for screening mammogram for malignant neoplasm of breast: Secondary | ICD-10-CM

## 2018-08-15 ENCOUNTER — Ambulatory Visit
Admission: RE | Admit: 2018-08-15 | Discharge: 2018-08-15 | Disposition: A | Discharge status: Home / Self Care | Payer: Medicare HMO | Source: Ambulatory Visit | Attending: Family Medicine | Admitting: Family Medicine

## 2018-08-15 DIAGNOSIS — Z1231 Encounter for screening mammogram for malignant neoplasm of breast: Secondary | ICD-10-CM

## 2019-01-14 ENCOUNTER — Other Ambulatory Visit: Payer: Self-pay | Admitting: *Deleted

## 2019-01-14 DIAGNOSIS — Z20822 Contact with and (suspected) exposure to covid-19: Secondary | ICD-10-CM

## 2019-01-18 NOTE — Addendum Note (Signed)
Addended by: Narayan Scull M on: 01/18/2019 03:21 PM   Modules accepted: Orders  

## 2019-08-27 ENCOUNTER — Other Ambulatory Visit: Payer: Self-pay | Admitting: Family Medicine

## 2019-08-27 DIAGNOSIS — Z1231 Encounter for screening mammogram for malignant neoplasm of breast: Secondary | ICD-10-CM

## 2019-10-08 ENCOUNTER — Ambulatory Visit: Payer: Medicare HMO

## 2019-12-28 ENCOUNTER — Ambulatory Visit
Admission: RE | Admit: 2019-12-28 | Discharge: 2019-12-28 | Disposition: A | Discharge status: Home / Self Care | Payer: Medicare HMO | Source: Ambulatory Visit | Attending: Family Medicine | Admitting: Family Medicine

## 2019-12-28 ENCOUNTER — Other Ambulatory Visit: Payer: Self-pay

## 2019-12-28 DIAGNOSIS — Z1231 Encounter for screening mammogram for malignant neoplasm of breast: Secondary | ICD-10-CM

## 2020-01-11 ENCOUNTER — Other Ambulatory Visit: Payer: Self-pay | Admitting: Family Medicine

## 2020-01-11 DIAGNOSIS — E2839 Other primary ovarian failure: Secondary | ICD-10-CM

## 2020-04-02 ENCOUNTER — Other Ambulatory Visit: Payer: Self-pay

## 2020-04-02 ENCOUNTER — Ambulatory Visit
Admission: RE | Admit: 2020-04-02 | Discharge: 2020-04-02 | Disposition: A | Discharge status: Home / Self Care | Payer: Medicare HMO | Source: Ambulatory Visit | Attending: Family Medicine | Admitting: Family Medicine

## 2020-04-02 DIAGNOSIS — E2839 Other primary ovarian failure: Secondary | ICD-10-CM

## 2020-09-04 LAB — EXTERNAL GENERIC LAB PROCEDURE: COLOGUARD: NEGATIVE

## 2021-01-27 ENCOUNTER — Other Ambulatory Visit: Payer: Self-pay | Admitting: Family Medicine

## 2021-01-27 DIAGNOSIS — Z1231 Encounter for screening mammogram for malignant neoplasm of breast: Secondary | ICD-10-CM

## 2021-03-20 ENCOUNTER — Other Ambulatory Visit: Payer: Self-pay

## 2021-03-20 ENCOUNTER — Ambulatory Visit
Admission: RE | Admit: 2021-03-20 | Discharge: 2021-03-20 | Disposition: A | Discharge status: Home / Self Care | Payer: Medicare HMO | Source: Ambulatory Visit | Attending: Family Medicine | Admitting: Family Medicine

## 2021-03-20 DIAGNOSIS — Z1231 Encounter for screening mammogram for malignant neoplasm of breast: Secondary | ICD-10-CM

## 2022-05-03 ENCOUNTER — Other Ambulatory Visit: Payer: Self-pay | Admitting: Family Medicine

## 2022-05-03 DIAGNOSIS — Z1231 Encounter for screening mammogram for malignant neoplasm of breast: Secondary | ICD-10-CM

## 2022-06-03 ENCOUNTER — Ambulatory Visit: Payer: Medicare HMO

## 2022-06-28 ENCOUNTER — Ambulatory Visit
Admission: RE | Admit: 2022-06-28 | Discharge: 2022-06-28 | Disposition: A | Discharge status: Home / Self Care | Payer: Medicare HMO | Source: Ambulatory Visit | Attending: Family Medicine | Admitting: Family Medicine

## 2022-06-28 DIAGNOSIS — Z1231 Encounter for screening mammogram for malignant neoplasm of breast: Secondary | ICD-10-CM

## 2022-11-28 ENCOUNTER — Encounter (HOSPITAL_COMMUNITY): Payer: Self-pay

## 2022-11-28 ENCOUNTER — Emergency Department (HOSPITAL_COMMUNITY)
Admission: EM | Admit: 2022-11-28 | Discharge: 2022-11-28 | Discharge status: Against Medical Advice | Payer: Medicare HMO | Attending: Emergency Medicine | Admitting: Emergency Medicine

## 2022-11-28 ENCOUNTER — Other Ambulatory Visit: Payer: Self-pay

## 2022-11-28 ENCOUNTER — Emergency Department (HOSPITAL_COMMUNITY): Payer: Medicare HMO

## 2022-11-28 DIAGNOSIS — M25512 Pain in left shoulder: Secondary | ICD-10-CM | POA: Diagnosis present

## 2022-11-28 DIAGNOSIS — Z5329 Procedure and treatment not carried out because of patient's decision for other reasons: Secondary | ICD-10-CM | POA: Insufficient documentation

## 2022-11-28 DIAGNOSIS — I10 Essential (primary) hypertension: Secondary | ICD-10-CM | POA: Diagnosis not present

## 2022-11-28 DIAGNOSIS — Z79899 Other long term (current) drug therapy: Secondary | ICD-10-CM | POA: Insufficient documentation

## 2022-11-28 DIAGNOSIS — Z7982 Long term (current) use of aspirin: Secondary | ICD-10-CM | POA: Insufficient documentation

## 2022-11-28 DIAGNOSIS — Z5321 Procedure and treatment not carried out due to patient leaving prior to being seen by health care provider: Secondary | ICD-10-CM

## 2022-11-28 NOTE — ED Notes (Signed)
Pt stated she was not willing to wait any longer. Pt left the ED.

## 2022-11-28 NOTE — ED Provider Notes (Signed)
[place] EMERGENCY DEPARTMENT AT [facility] Provider Note   CSN: [id number] Arrival date & time: 11/28/22  1345     History  Shoulder pain   Sharday Kreiter is a 77 y.o. female history of hypertension here for evaluation of left shoulder pain.  States that she went to pick up a child that was 67 months old.  Child apparently squirmed which she "jerked" her left shoulder.  She has pain to her left shoulder since.  This occurred just PTA.  No numbness or weakness.  No midline back pain, chest pain, shortness of breath, numbness, weakness, swelling to extremities.  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  She denies any recent falls.  Typically completes her ADLs without any DOE, exertional chest pain.  HPI     Home Medications Prior to Admission medications   Medication Sig Start Date End Date Taking? Authorizing Provider  acetaminophen (TYLENOL) 500 MG tablet Take 500 mg by mouth every 6 (six) hours as needed for mild pain or fever.    [provider]  albuterol (PROVENTIL HFA;VENTOLIN HFA) 108 (90 BASE) MCG/ACT inhaler Inhale 1-2 puffs into the lungs every 6 (six) hours as needed for wheezing or shortness of breath (or persistent coughing). Patient not taking: Reported on 06/24/2018 07/03/14   Ria Clock, PA  amLODipine (NORVASC) 10 MG tablet Take 10 mg by mouth daily.    [provider]  aspirin 81 MG chewable tablet Chew 81 mg by mouth daily.    [provider]  atorvastatin (LIPITOR) 10 MG tablet Take 10 mg by mouth. 02/21/18   [provider]  benzonatate (TESSALON) 100 MG capsule Take 1 capsule (100 mg total) by mouth 3 (three) times daily as needed for cough. 07/03/14   Presson, Mathis Fare, PA  Calcium-Magnesium-Vitamin D (CALCIUM 1200+D3 PO) Take 1 tablet by mouth daily.     [provider]  doxycycline  (VIBRAMYCIN) 100 MG capsule Take 1 capsule (100 mg total) by mouth 2 (two) times daily. One po bid x 7 days 06/24/18   Mancel Bale, MD  guaiFENesin-codeine 100-10 MG/5ML syrup Take 10 mLs by mouth every 6 (six) hours as needed for cough. Patient not taking: Reported on 06/24/2018 09/18/13   Graylon Good, PA-C  hydrochlorothiazide (HYDRODIURIL) 25 MG tablet Take 25 mg by mouth daily.    [provider]  ondansetron (ZOFRAN) 4 MG tablet Take 1 tablet (4 mg total) by mouth every 8 (eight) hours as needed for nausea or vomiting. Patient not taking: Reported on 06/24/2018 04/11/14   Rodolph Bong, MD      Allergies    Patient has no known allergies.    Review of Systems   Review of Systems  Constitutional: Negative.   HENT: Negative.    Respiratory: Negative.    Cardiovascular: Negative.   Gastrointestinal: Negative.   Genitourinary: Negative.   Musculoskeletal:  Negative for back pain, neck pain and neck stiffness.       Left shoulder pain  Skin: Negative.   Neurological: Negative.   All other systems reviewed and are negative.   Physical Exam Updated Vital Signs BP (!) 178/81   Pulse 63   Temp 98.2 F (36.8 C) (Oral)   Resp 16   SpO2 99%  Physical Exam Vitals and nursing note reviewed.  Constitutional:  General: She is not in acute distress.    Appearance: She is well-developed. She is not ill-appearing, toxic-appearing or diaphoretic.  HENT:     Head: Normocephalic and atraumatic.  Eyes:     Pupils: Pupils are equal, round, and reactive to light.  Cardiovascular:     Rate and Rhythm: Normal rate.     Pulses: Normal pulses.          Radial pulses are 2+ on the left side.     Heart sounds: Normal heart sounds.  Pulmonary:     Effort: Pulmonary effort is normal. No respiratory distress.     Breath sounds: Normal breath sounds.  Abdominal:     General: There is no distension.  Musculoskeletal:        General: Tenderness present. No swelling or  deformity. Normal range of motion.     Cervical back: Normal range of motion.     Right lower leg: No edema.     Left lower leg: No edema.     Comments: No midline C/T tenderness.  Diffuse tenderness about left anterior shoulder.  Nontender, midshaft, distal humerus.  No obvious deformity.  Compartments are soft.  No overlying erythema, warmth, soft tissue swelling  Skin:    General: Skin is warm and dry.     Capillary Refill: Capillary refill takes less than 2 seconds.     Findings: No abrasion, abscess, acne, bruising, burn, ecchymosis, erythema, signs of injury, laceration, lesion, petechiae, rash or wound.  Neurological:     General: No focal deficit present.     Mental Status: She is alert.     Cranial Nerves: Cranial nerves 2-12 are intact.     Sensory: Sensation is intact.     Motor: Motor function is intact.  Psychiatric:        Mood and Affect: Mood normal.     ED Results / Procedures / Treatments   Labs (all labs ordered are listed, but only abnormal results are displayed) Labs Reviewed - No data to display  EKG None  Radiology DG Shoulder Left  Result Date: 11/28/2022 CLINICAL DATA:  Injured shoulder while holding a child. EXAM: LEFT SHOULDER - 2+ VIEW COMPARISON:  None Available. FINDINGS: No fracture.  No bone lesion. Glenohumeral joint normally spaced and aligned. AC joint normally spaced and aligned. Broad subacromial spur. Skeletal structures are demineralized. Soft tissues are unremarkable. IMPRESSION: 1. No fracture. 2. Well maintained glenohumeral and AC joints. 3. Subacromial spur. Electronically Signed   By: Amie Portland M.D.   On: 11/28/2022 15:28    Procedures Procedures    Medications Ordered in ED Medications - No data to display  ED Course/ Medical Decision Making/ A&P    77 year old here for evaluation after left shoulder pain after picking up a grandchild.  Patient neurovascularly intact, compartments soft.  She denies any chest pain, shortness  of breath, jaw pain.  Pain worsens with overhead motion.  She is followed by an orthopedist office [address] however she does not know the name of that provider.  She does note that she was previously diagnosed with arthritis in her shoulders.  She has no numbness or weakness.  She is neurovascularly intact.  She is no evidence of compartment syndrome.  Is 2+ radial pulses bilaterally.  No overlying skin changes to suggest gout, septic joint, hemarthrosis.  Nontender midshaft, distal humerus, forearm.  No midline C/T/L tenderness.  She is nonfocal neuroexam.  No chest pain, shortness of breath, back pain  or exertional symptoms to suggest atypical ACS, PE, dissection.  Low suspicion for acute arterial or vascular occlusion.  Will plan on imaging  Imaging personally viewed and interpreted X-ray left shoulder without significant abnormality  I went to reassess patient, discussed remaining workup however nursing stated patient had eloped from the emergency department.  I did not get to speak with patient prior to her eloping.                            Medical Decision Making Amount and/or Complexity of Data Reviewed External Data Reviewed: radiology and notes. Radiology: ordered and independent interpretation performed. Decision-making details documented in ED Course.  Risk OTC drugs. Prescription drug management. Diagnosis or treatment significantly limited by social determinants of health.           Final Clinical Impression(s) / ED Diagnoses Final diagnoses:  Acute pain of left shoulder  Eloped from emergency department    Rx / DC Orders ED Discharge Orders     None         Ares Tegtmeyer A, PA-C 11/28/22 2117    Charlynne Pander, MD 12/01/22 2151

## 2022-11-28 NOTE — ED Triage Notes (Signed)
Patient complains of left shoulder pain x 3 hours after injurying while holding child

## 2023-04-10 IMAGING — MG MM DIGITAL SCREENING BILAT W/ TOMO AND CAD
8 series · 9 of 24 positions shown · non-contrast
Comparison: Previous exam(s).

CLINICAL DATA: Screening.

EXAM:
DIGITAL SCREENING BILATERAL MAMMOGRAM WITH TOMOSYNTHESIS AND CAD
TECHNIQUE: Bilateral screening digital craniocaudal and mediolateral oblique
mammograms were obtained. Bilateral screening digital breast
tomosynthesis was performed. The images were evaluated with
computer-aided detection.

[R MLO synth-2D]
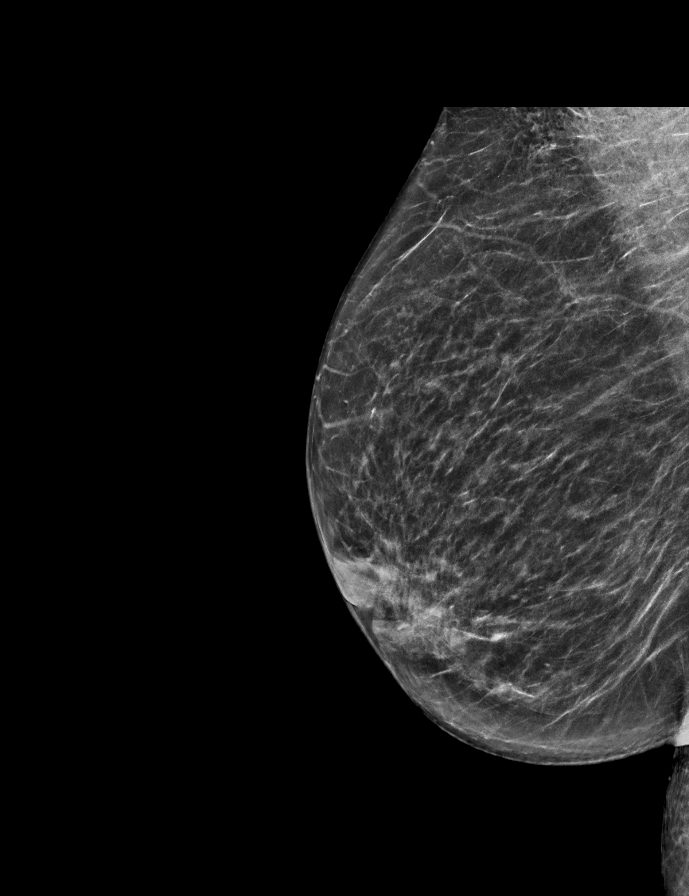

[L CC synth-2D]
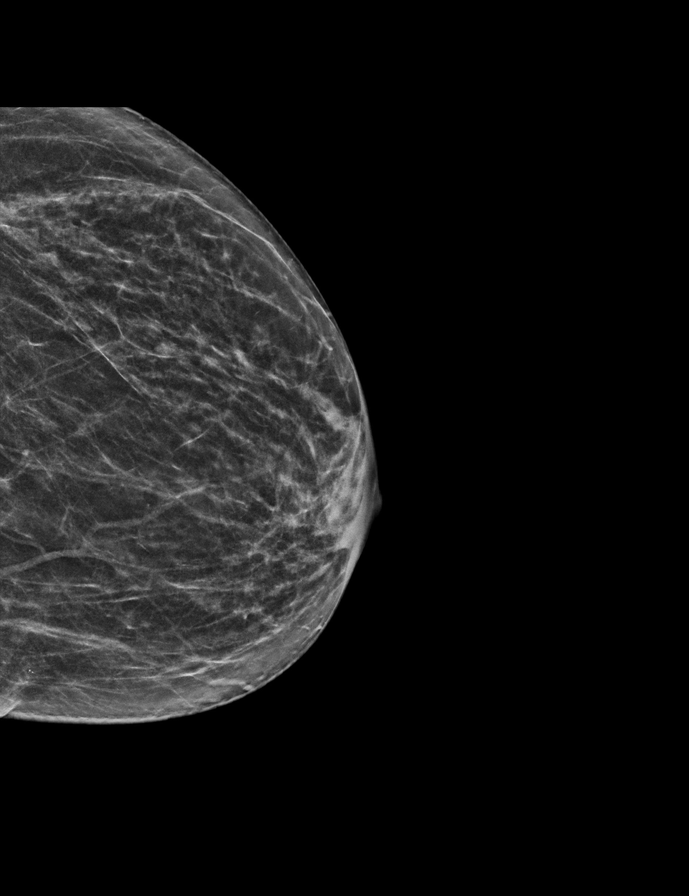

[R CC synth-2D]
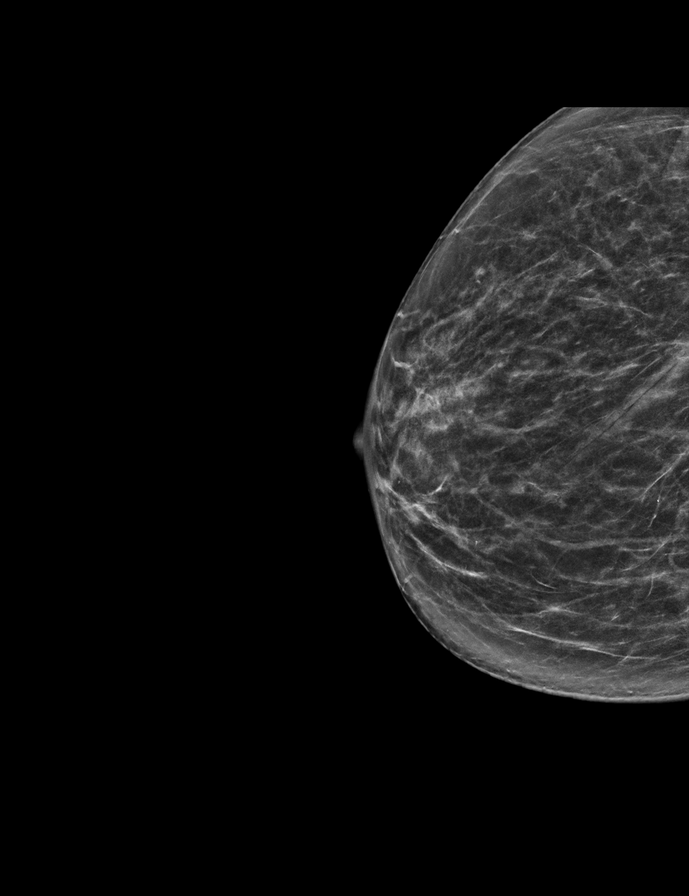

[L MLO synth-2D]
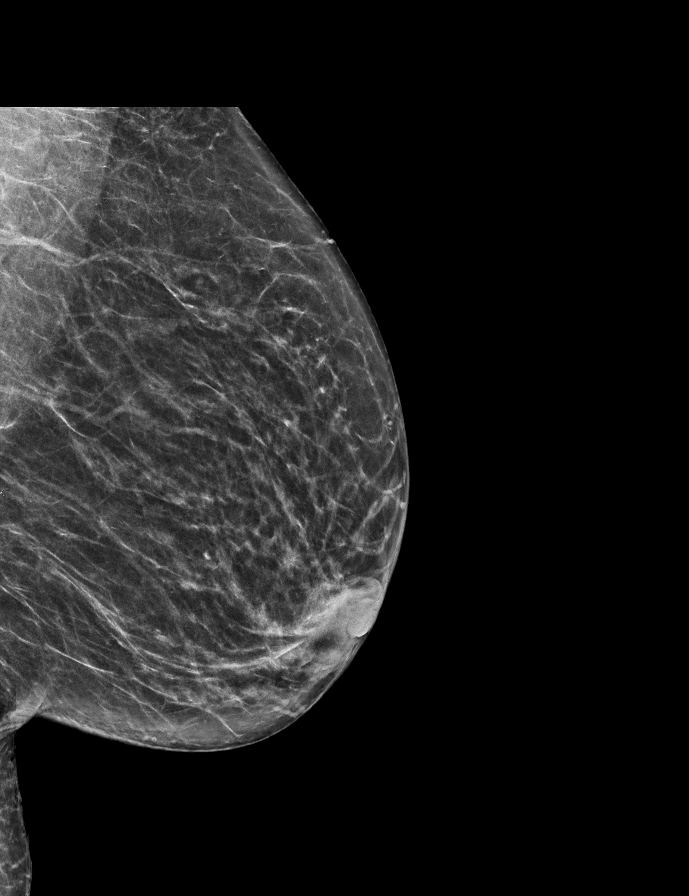

[L CC tomo · 2 of 54 frames shown]
[frame 18/54]
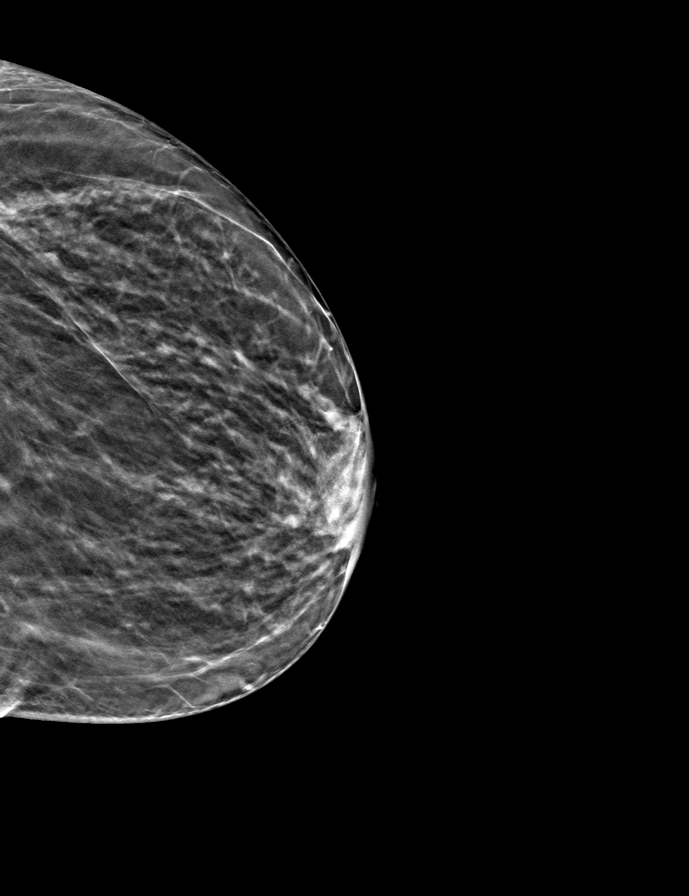
[frame 27/54]
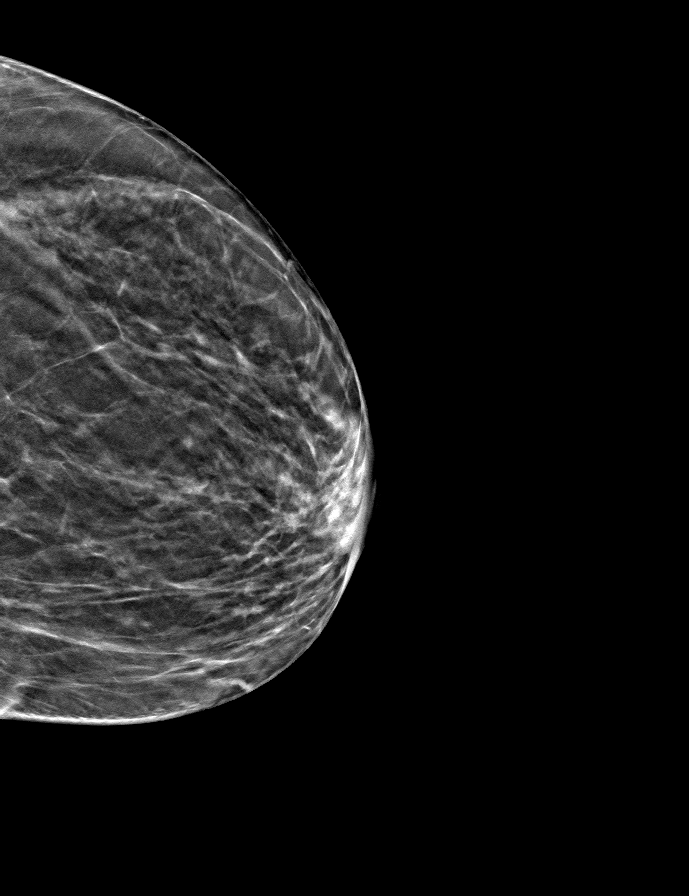

[R CC tomo · tomo slice 29/57.0]
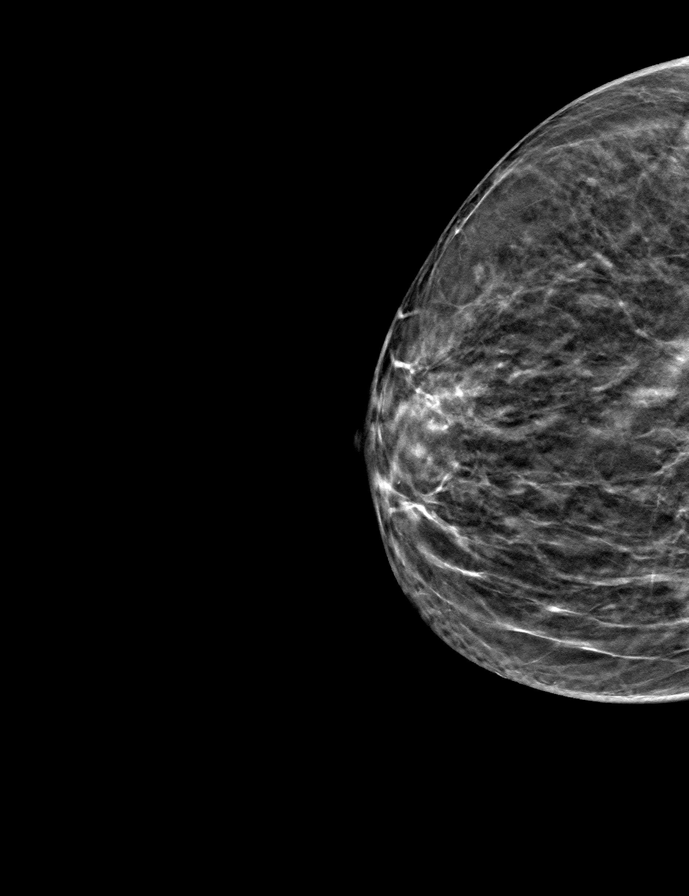

[R MLO tomo · tomo slice 31/62.0]
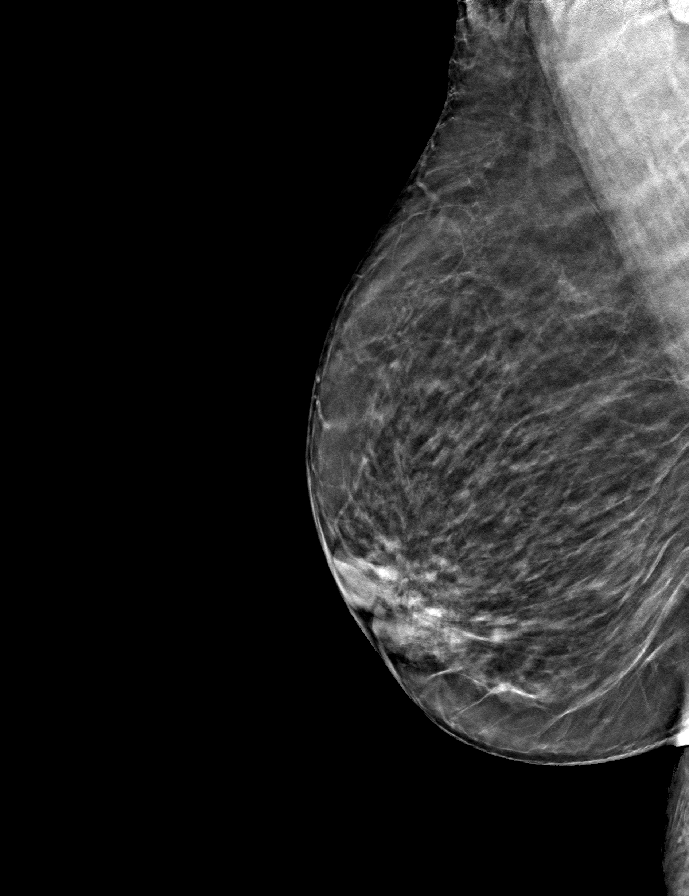

[L MLO tomo · tomo slice 31/60.0]
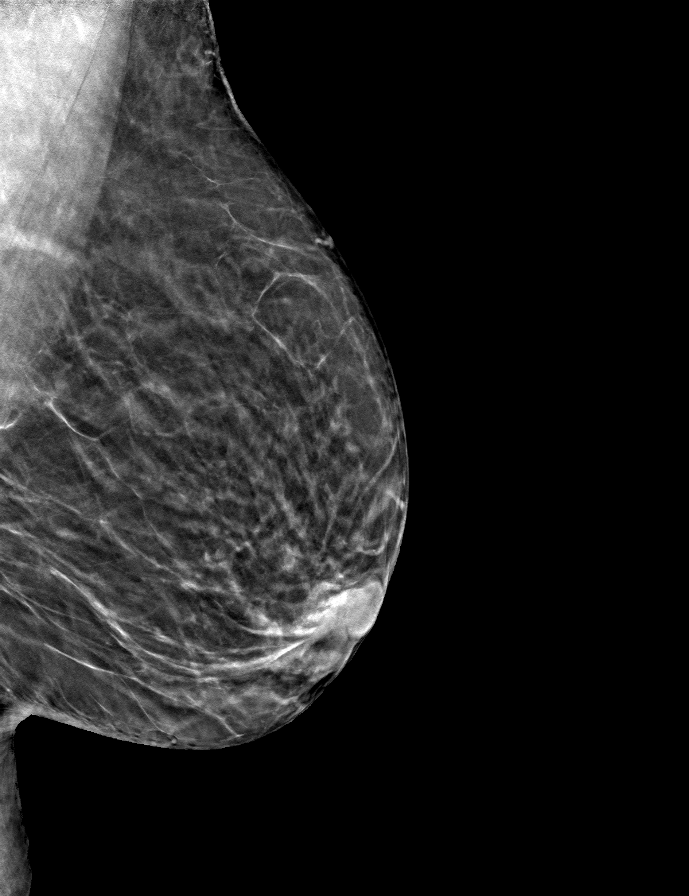

[9 of 24 positions shown; findings below may reference images not displayed]

ACR Breast Density Category b: There are scattered areas of
fibroglandular density.
FINDINGS: There are no findings suspicious for malignancy.
IMPRESSION: No mammographic evidence of malignancy. A result letter of this
screening mammogram will be mailed directly to the patient.

RECOMMENDATION:
Screening mammogram in one year. (Code:51-O-LD2)

BI-RADS CATEGORY  1: Negative.

## 2023-08-04 ENCOUNTER — Other Ambulatory Visit: Payer: Self-pay | Admitting: Psychiatry

## 2023-08-04 DIAGNOSIS — Z1231 Encounter for screening mammogram for malignant neoplasm of breast: Secondary | ICD-10-CM

## 2023-08-23 ENCOUNTER — Ambulatory Visit: Payer: Medicare HMO

## 2023-09-09 ENCOUNTER — Ambulatory Visit: Payer: Medicare HMO

## 2023-10-20 ENCOUNTER — Ambulatory Visit

## 2023-11-15 ENCOUNTER — Ambulatory Visit
Admission: RE | Admit: 2023-11-15 | Discharge: 2023-11-15 | Disposition: A | Discharge status: Home / Self Care | Source: Ambulatory Visit | Attending: Psychiatry | Admitting: Psychiatry

## 2023-11-15 DIAGNOSIS — Z1231 Encounter for screening mammogram for malignant neoplasm of breast: Secondary | ICD-10-CM
